# Patient Record
Sex: Male | Born: 1960 | Hispanic: No | Smoking: Former smoker
Health system: Southern US, Community
[De-identification: ages and names within clinical notes are randomized; demographics above are authoritative.]

---

## 2018-02-15 ENCOUNTER — Encounter: Payer: Self-pay | Admitting: Emergency Medicine

## 2018-02-15 ENCOUNTER — Emergency Department
Admission: EM | Admit: 2018-02-15 | Discharge: 2018-02-15 | Disposition: A | Discharge status: Home / Self Care | Payer: No Typology Code available for payment source | Attending: Emergency Medicine | Admitting: Emergency Medicine

## 2018-02-15 ENCOUNTER — Emergency Department: Payer: No Typology Code available for payment source

## 2018-02-15 ENCOUNTER — Other Ambulatory Visit: Payer: Self-pay

## 2018-02-15 DIAGNOSIS — M25512 Pain in left shoulder: Secondary | ICD-10-CM | POA: Insufficient documentation

## 2018-02-15 DIAGNOSIS — M542 Cervicalgia: Secondary | ICD-10-CM | POA: Diagnosis present

## 2018-02-15 DIAGNOSIS — Y9389 Activity, other specified: Secondary | ICD-10-CM | POA: Diagnosis not present

## 2018-02-15 DIAGNOSIS — Y999 Unspecified external cause status: Secondary | ICD-10-CM | POA: Insufficient documentation

## 2018-02-15 DIAGNOSIS — M25511 Pain in right shoulder: Secondary | ICD-10-CM | POA: Diagnosis not present

## 2018-02-15 DIAGNOSIS — R51 Headache: Secondary | ICD-10-CM | POA: Diagnosis not present

## 2018-02-15 DIAGNOSIS — Y92410 Unspecified street and highway as the place of occurrence of the external cause: Secondary | ICD-10-CM | POA: Diagnosis not present

## 2018-02-15 DIAGNOSIS — Z87891 Personal history of nicotine dependence: Secondary | ICD-10-CM | POA: Diagnosis not present

## 2018-02-15 DIAGNOSIS — M7918 Myalgia, other site: Secondary | ICD-10-CM

## 2018-02-15 DIAGNOSIS — M5489 Other dorsalgia: Secondary | ICD-10-CM | POA: Diagnosis not present

## 2018-02-15 MED ORDER — CYCLOBENZAPRINE HCL 5 MG PO TABS: 5.0000 mg | ORAL_TABLET | Freq: Three times a day (TID) | ORAL | 0 refills | Status: AC | PRN

## 2018-02-15 MED ORDER — NAPROXEN 500 MG PO TABS
500.0000 mg | ORAL_TABLET | Freq: Once | ORAL | Status: AC
  Administered 2018-02-15: 500 mg via ORAL
  Filled 2018-02-15: qty 1

## 2018-02-15 MED ORDER — NAPROXEN 500 MG PO TABS: 500.0000 mg | ORAL_TABLET | Freq: Two times a day (BID) | ORAL | 0 refills | Status: AC

## 2018-02-15 MED ORDER — CYCLOBENZAPRINE HCL 10 MG PO TABS
10.0000 mg | ORAL_TABLET | Freq: Once | ORAL | Status: AC
  Administered 2018-02-15: 10 mg via ORAL
  Filled 2018-02-15: qty 1

## 2018-02-15 NOTE — ED Notes (Signed)
C collar placed in triage

## 2018-02-15 NOTE — ED Notes (Signed)
Pt ambulatory to POV without difficulty. VSS. NAD. Discharge instructions, RX and follow up reviewed. All questions and concerns addressed.  
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
## 2018-02-15 NOTE — ED Notes (Signed)
Interpreter paged.
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
